# Patient Record
Sex: Female | Born: 1985 | Race: White | Hispanic: No | Marital: Married | Smoking: Current every day smoker
Health system: Southern US, Community
[De-identification: ages and names within clinical notes are randomized; demographics above are authoritative.]

## PROBLEM LIST (undated history)

## (undated) DIAGNOSIS — B977 Papillomavirus as the cause of diseases classified elsewhere: Secondary | ICD-10-CM

## (undated) DIAGNOSIS — K219 Gastro-esophageal reflux disease without esophagitis: Secondary | ICD-10-CM

## (undated) DIAGNOSIS — D496 Neoplasm of unspecified behavior of brain: Secondary | ICD-10-CM

## (undated) DIAGNOSIS — G40909 Epilepsy, unspecified, not intractable, without status epilepticus: Secondary | ICD-10-CM

## (undated) DIAGNOSIS — G35 Multiple sclerosis: Secondary | ICD-10-CM

## (undated) DIAGNOSIS — Z789 Other specified health status: Secondary | ICD-10-CM

## (undated) HISTORY — DX: Gastro-esophageal reflux disease without esophagitis: K21.9

## (undated) HISTORY — DX: Multiple sclerosis: G35

## (undated) HISTORY — DX: Neoplasm of unspecified behavior of brain: D49.6

## (undated) HISTORY — PX: OTHER SURGICAL HISTORY: SHX169

## (undated) HISTORY — DX: Other specified health status: Z78.9

## (undated) HISTORY — DX: Epilepsy, unspecified, not intractable, without status epilepticus: G40.909

## (undated) HISTORY — DX: Papillomavirus as the cause of diseases classified elsewhere: B97.7

---
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
## 2006-08-05 ENCOUNTER — Emergency Department: Payer: Self-pay | Admitting: Emergency Medicine

## 2007-01-02 ENCOUNTER — Emergency Department: Payer: Self-pay | Admitting: Emergency Medicine

## 2007-06-05 ENCOUNTER — Ambulatory Visit: Payer: Self-pay | Admitting: Internal Medicine

## 2007-06-11 ENCOUNTER — Emergency Department: Payer: Self-pay | Admitting: Emergency Medicine

## 2007-09-08 ENCOUNTER — Ambulatory Visit: Payer: Self-pay | Admitting: Obstetrics and Gynecology

## 2007-12-11 ENCOUNTER — Observation Stay: Payer: Self-pay | Admitting: Obstetrics and Gynecology

## 2007-12-12 ENCOUNTER — Ambulatory Visit: Payer: Self-pay | Admitting: Obstetrics and Gynecology

## 2008-01-11 ENCOUNTER — Inpatient Hospital Stay: Payer: Self-pay | Admitting: Obstetrics and Gynecology

## 2008-05-18 ENCOUNTER — Ambulatory Visit: Payer: Self-pay | Admitting: Internal Medicine

## 2008-06-21 ENCOUNTER — Other Ambulatory Visit: Payer: Self-pay

## 2008-06-21 ENCOUNTER — Ambulatory Visit: Payer: Self-pay | Admitting: Obstetrics and Gynecology

## 2008-12-04 ENCOUNTER — Emergency Department: Payer: Self-pay | Admitting: Unknown Physician Specialty

## 2008-12-06 ENCOUNTER — Emergency Department: Payer: Self-pay | Admitting: Emergency Medicine

## 2009-06-06 ENCOUNTER — Ambulatory Visit: Payer: Self-pay | Admitting: Obstetrics and Gynecology

## 2009-07-02 ENCOUNTER — Inpatient Hospital Stay: Payer: Self-pay | Admitting: Obstetrics and Gynecology

## 2009-07-16 ENCOUNTER — Ambulatory Visit: Payer: Self-pay | Admitting: Pediatrics

## 2009-09-19 ENCOUNTER — Ambulatory Visit: Payer: Self-pay | Admitting: Family Medicine

## 2009-09-28 ENCOUNTER — Ambulatory Visit: Payer: Self-pay | Admitting: Surgery

## 2010-02-08 ENCOUNTER — Emergency Department: Payer: Self-pay | Admitting: Emergency Medicine

## 2010-06-21 ENCOUNTER — Ambulatory Visit: Payer: Self-pay | Admitting: Obstetrics and Gynecology

## 2010-08-05 ENCOUNTER — Ambulatory Visit: Payer: Self-pay | Admitting: Internal Medicine

## 2010-11-25 ENCOUNTER — Encounter: Payer: Self-pay | Admitting: Maternal & Fetal Medicine

## 2011-04-09 ENCOUNTER — Inpatient Hospital Stay: Payer: Self-pay | Admitting: Obstetrics and Gynecology

## 2011-04-17 ENCOUNTER — Ambulatory Visit: Payer: Self-pay

## 2011-06-01 ENCOUNTER — Emergency Department: Payer: Self-pay | Admitting: Emergency Medicine

## 2011-06-01 DIAGNOSIS — R569 Unspecified convulsions: Secondary | ICD-10-CM | POA: Insufficient documentation

## 2011-10-09 ENCOUNTER — Ambulatory Visit: Payer: Self-pay | Admitting: Internal Medicine

## 2012-04-04 ENCOUNTER — Ambulatory Visit: Payer: Self-pay | Admitting: Internal Medicine

## 2012-06-07 DIAGNOSIS — G35 Multiple sclerosis: Secondary | ICD-10-CM | POA: Insufficient documentation

## 2012-12-30 DIAGNOSIS — G939 Disorder of brain, unspecified: Secondary | ICD-10-CM | POA: Insufficient documentation

## 2013-03-09 ENCOUNTER — Ambulatory Visit: Payer: Self-pay | Admitting: Family Medicine

## 2013-03-09 LAB — PREGNANCY, URINE: Pregnancy Test, Urine: NEGATIVE m[IU]/mL

## 2013-03-29 DIAGNOSIS — L732 Hidradenitis suppurativa: Secondary | ICD-10-CM | POA: Insufficient documentation

## 2013-05-26 DIAGNOSIS — G40309 Generalized idiopathic epilepsy and epileptic syndromes, not intractable, without status epilepticus: Secondary | ICD-10-CM | POA: Insufficient documentation

## 2014-02-27 ENCOUNTER — Encounter: Payer: Self-pay | Admitting: Obstetrics and Gynecology

## 2014-04-06 ENCOUNTER — Encounter: Payer: Self-pay | Admitting: Obstetrics and Gynecology

## 2014-04-25 ENCOUNTER — Encounter: Payer: Self-pay | Admitting: Pediatric Cardiology

## 2014-08-29 ENCOUNTER — Inpatient Hospital Stay: Payer: Self-pay | Admitting: Obstetrics and Gynecology

## 2014-08-29 LAB — CBC WITH DIFFERENTIAL/PLATELET
Basophil #: 0.1 10*3/uL (ref 0.0–0.1)
Basophil %: 0.4 %
EOS ABS: 0.1 10*3/uL (ref 0.0–0.7)
Eosinophil %: 0.6 %
HCT: 40.2 % (ref 35.0–47.0)
HGB: 13.1 g/dL (ref 12.0–16.0)
Lymphocyte #: 3.3 10*3/uL (ref 1.0–3.6)
Lymphocyte %: 26 %
MCH: 31.8 pg (ref 26.0–34.0)
MCHC: 32.6 g/dL (ref 32.0–36.0)
MCV: 98 fL (ref 80–100)
MONO ABS: 0.4 x10 3/mm (ref 0.2–0.9)
MONOS PCT: 3 %
NEUTROS PCT: 70 %
Neutrophil #: 9 10*3/uL — ABNORMAL HIGH (ref 1.4–6.5)
PLATELETS: 211 10*3/uL (ref 150–440)
RBC: 4.12 10*6/uL (ref 3.80–5.20)
RDW: 12.6 % (ref 11.5–14.5)
WBC: 12.8 10*3/uL — AB (ref 3.6–11.0)

## 2014-08-30 LAB — HEMATOCRIT: HCT: 35.6 % (ref 35.0–47.0)

## 2015-01-06 NOTE — Consult Note (Signed)
Referral Information:  Reason for Referral Recommendations during pregnancy secondary to history of seizures on 2 antiepileptics, multiple sclerosis and low grade brain tumor   Referring Physician [facility]   Prenatal Hx Pregnancy has thus far been uncomplicated.  She has stopped her MS medication and started 4 mg (total) folic acid.  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  She has a history of 2 tonic-clonic seizures about 45 minutes apart, 8 weeks after her last delivery and has never had a subsequent tonic-clonic seizure.  She has a life-long history of temporal lobe seizures which she only found out was likely related to this brain lesion after imaging was performed after her tonic-clonic seizure.  She has since started Lamictal for that which has greatly decreased the frequency of these episodes (usually present with aura, feelings of deja vu and behavioral changes).  She had a Wada scan (to evaluate language and memory evaluation of each hemisphere) during the work up to determine if this lesion could be biopsied or excised.  Ultimately it was decided that given it's stability over time and location, that surgery was currently not an option.   Past Obstetrical Hx 2008 - spontaneous miscarriage 6-8 weeks 2009 SVD at 38+ weeks, live female infant, 4#09 oz, no complications 7353 SVE at 55 weeks, IOL, female infant, 2#99ME, no complications 2683 SVE at 21 weeks, live female, 4#1DQ, no complications until presented to [facility] after having 2 tonic-clonic seizures about 8 weeks postpartum.  Uncertain if this was related to eclampsia (no reportably elevated BPs during the pregnancy) or new onset seizure disorder - started on Keppra.  No tonic-clonic seizures since. 2015 - current   Home Medications: Medication Instructions Status  Nexium 20 mg oral delayed release capsule 1  orally once a day  Active   Keppra 1000 milligram(s) orally 2 times a day Active  LaMICtal 250 mg oral tablet, extended release 1 tab(s) orally once a day Active  Prenatal Multivitamins Prenatal Multivitamins oral tablet 1 tab(s) orally once a day Active  Vitamin D 1  orally once a day Active   Allergies:   Phenergan: Other  Clindamycin: Other  Vital Signs/Notes:  Nursing Vital Signs: **Vital Signs.:   15-Jun-15 09:18  Vital Signs Type Routine  Temperature Temperature (F) 97.1  Celsius 36.1  Temperature Source oral  Pulse Pulse 61  Respirations Respirations 18  Systolic BP Systolic BP 222  Diastolic BP (mmHg) Diastolic BP (mmHg) 65  Mean BP 81  Pulse Ox % Pulse Ox % 100  Oxygen Delivery Room Air/ 21 %   Perinatal Consult:  PGyn Hx History of LEEP age 75; normal PAPs since   PMed Hx Rubella Immune, Hx of varicella   Past Medical History cont'd 1.  GERD - on Nexium 2.  Seizure disorder - see above.  Diagnosed after presenting with 2 tonic-clonic seizures 8 weeks postpartum from her last delivery.  None since after starting Keppra.   3.  Low grade right temporal brain tumor.  She was also diagnosed with temporal lobe seizures after imaging identified a right temporal lobe lesion, thought to be a low grade tumor.  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  She had a Wada scan as well during a workup to determine if she was a candidate for excisional biopsy - she was ultimately determined not to be a candidate and has been followed every  6 months with imaging and no change in size of the tumor has been appreciated.  She started Lamictal about 8 months ago to decrease these seizures (which she describes as deja vu like episodes with onset of aura and funny taste in her mouth and sometimes accompanied by behavioral changes). Of note, the patient reports getting a clot after the angiogram/Wada scan which was treated with Lovenox for about 3 months (these records were not  available for review); given these vessels were instrumented, she is not likely to be a increased risk for recurrence or at significantly increased risk for clot during this pregnancy. 4.  Multiple sclerosis - presumptive diagnosis based on demyelinating lesions also found incidentally during imaging w/up of seizures.  Had a lumbar puncture with evidence of a positive IgG index and oligoclonal band elevation - no symptoms per patient but has been on Tecfidera which she stopped as soon as she found out she was pregnant.   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   Soc Hx Married, quit smoking about 7 years ago; no ETOH or illegal drug use during pregnancy   Review Of Systems:  Fever/Chills No   Cough No   Abdominal Pain No   Diarrhea No   Constipation No   Nausea/Vomiting No   SOB/DOE No   Chest Pain No   Dysuria No   Tolerating Diet Yes   Medications/Allergies Reviewed Medications/Allergies reviewed   Impression/Recommendations:  Impression 29yo at [redacted]w[redacted]d by [facility] of 12/22 (assigned based on [place] performed in April - report not available) with history of seizure disorder, right temporal lobe low grade tumor and MS   Recommendations 1.  Multiple sclerosis - as an autoimmune disorder, patients tend to do very well during pregnancy although they may have an increase in symptoms/relapse postpartum.  As the patient has never has symptoms specifically related to this diagnosis, she is likely to do well.  Recommend continued follow up with her Neurologist to discuss when, postpartum, she should resume her medication. 2.  Epilespy - no tonic-clonic seizures since 3976 - uncertain if this was a new diagnosis or related to postpartum  eclampsia (although cannot rule out, without other symptoms of preeclampsia, this would be unlikely).  Continue Keppra per her Neurologist.  Continue Lamictal per her Neurologist for temporal lobe epilepsy - per the patient, she rarely has these episodes since starting the Lamictal.  Recommend checking levels at least each trimester (or if patient has a seizure).  Agree with folic acid supplementation.  Recommend detailed ultrasound at about 18 weeks (these antiepileptics can be associated with fetal clefting) and fetal echocardiogram at about 22 weeks.  Consider serial ultrasounds for growth in the 3rd trimester and delivery by [facility]. 3.  Brain tumor - unlikely to be an issue given small and stable size since it's discovery in 2012.  Likely it was present during patient's prior pregnancies and she had no complication with pushing/delivery.  Would not defer imaging during the pregnancy if indicated. 4.  Routine prenatal care - patient had first trimester screening performed last week; consider msAFP only after 15 weeks and ultrasounds/fetal echocardiogram as outlined above.   Plan:  Ultrasound at what gestational ages Monthly > 28 weeks   Delivery Mode  Vaginal   Additional Testing Folate/prenatal vitamins    Total Time Spent with Patient 45 minutes   >50% of visit spent in couseling/coordination of care yes   Office Use Only 99243  Level 3 (88min) NEW office consult detailed   Coding Description: MATERNAL CONDITIONS/HISTORY INDICATION(S).   Seizure Disorder.  Electronic Signatures: Wynona Neat (MD)  (Signed 15-Jun-15 11:39)  Authored: Referral, Home Medications, Allergies, Vital Signs/Notes, Consult, Exam, Impression, Plan, Billing, Coding Description   Last Updated: 15-Jun-15 11:39 by Wynona Neat (MD)

## 2015-01-15 IMAGING — CR DG CHEST 2V
1 series · 2 of 2 positions shown · non-contrast
Comparison: none

REASON FOR EXAM: chest pain and SOB started last night.
COMMENTS:

PROCEDURE:     MDR - MDR CHEST PA(OR AP) AND LATERAL  - March 09, 2013  [DATE]
RESULT:     The lungs are clear. The cardiac silhouette and visualized bony
skeleton are unremarkable.

[Series 1: pa · 0.17mm/px · 2 of 2 slices shown]
[im 1/2]
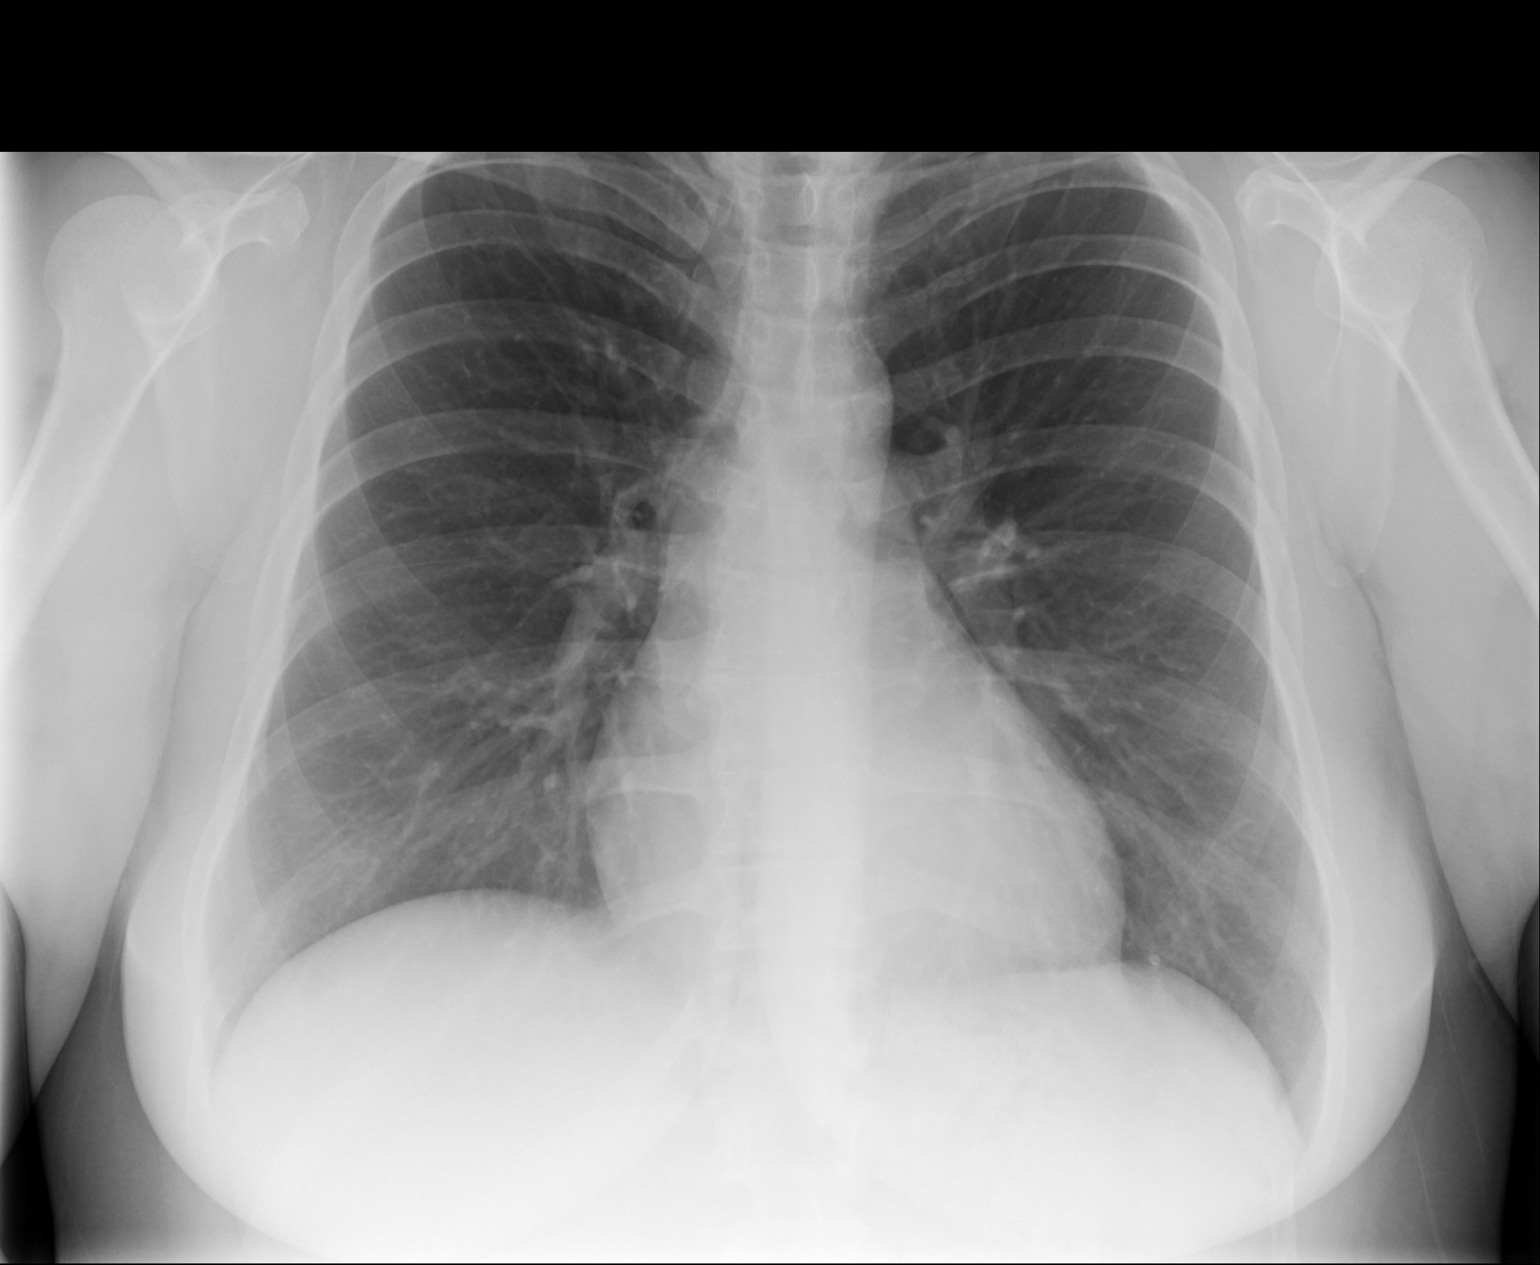
[im 2/2]
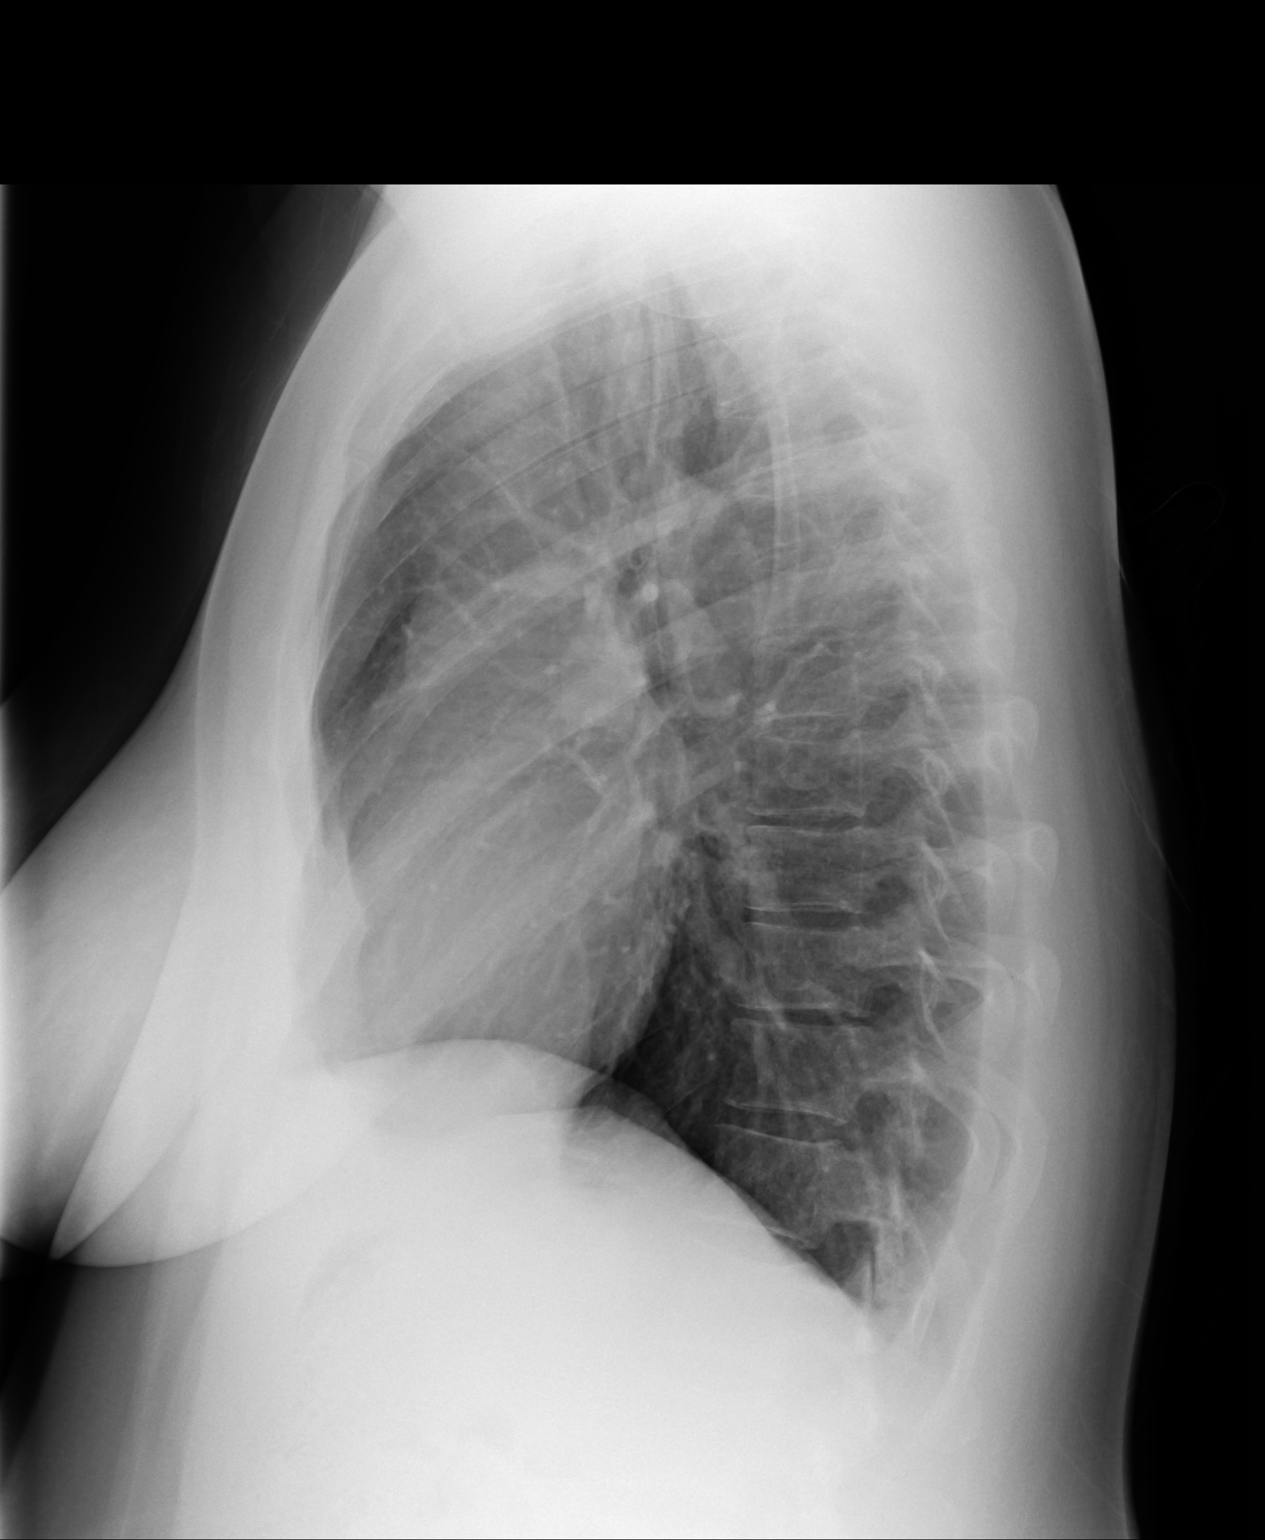

[2 of 2 positions shown; findings below may reference images not displayed]

IMPRESSION: 1. Chest radiograph without evidence of acute cardiopulmonary disease.
Comparison made to prior study dated 06/01/2011

## 2015-01-23 NOTE — H&P (Signed)
L&D Evaluation:  History:  HPI 84 yomwf [id number], estimated date of confinement 09/05/2014, admitted for IOL at 35.o weeks.   Patient's Medical History Brain tumor; morphine sulfate;Epilepsy; Rh-   Patient's Surgical History none   Medications Pre Natal Vitamins  Keppra;Lamictal; Nexium; Folic Acid   Allergies NKDA   Social History none   Family History Non-Contributory   ROS:  ROS All systems were reviewed.  HEENT, CNS, GI, GU, Respiratory, CV, Renal and Musculoskeletal systems were found to be normal.   Exam:  Vital Signs stable   General no apparent distress   Mental Status clear   Abdomen gravid, non-tender   Estimated Fetal Weight Average for gestational age, 8#0   Back no CVAT   Edema 1+   Pelvic no external lesions, 3-4/80/-2/vtx   Mebranes Intact   FHT normal rate with no decels   Skin dry   Lymph no lymphadenopathy   Other Rh-;GBS-   Impression:  Impression TIUP; Rh-; Seizure Disorder, MS, Brain Tumor   Plan:  Plan Pitocin IOL   Follow Up Appointment in 6 weeks   Electronic Signatures: DeFrancesco, Alanda Slim (MD)  (Signed 15-Dec-15 09:51)  Authored: L&D Evaluation   Last Updated: 15-Dec-15 09:51 by DeFrancesco, Alanda Slim (MD)

## 2015-02-22 ENCOUNTER — Telehealth: Payer: Self-pay | Admitting: Obstetrics and Gynecology

## 2015-02-22 DIAGNOSIS — B373 Candidiasis of vulva and vagina: Secondary | ICD-10-CM

## 2015-02-22 DIAGNOSIS — B3731 Acute candidiasis of vulva and vagina: Secondary | ICD-10-CM

## 2015-02-22 MED ORDER — FLUCONAZOLE 150 MG PO TABS: 150.0000 mg | ORAL_TABLET | Freq: Every day | ORAL | Status: DC

## 2015-02-22 NOTE — Telephone Encounter (Signed)
PT CAME 1 MONTH AGO FOR YEAST AND WAS GIVEN DIFLUCAN, PROBLEM STILL EXIST BUT WORSE, BURNING ITCHING, WHITE DISCHARGE, HAS USED OTC MONISTAT CREAM,ALSO.  SHE ALSO FEELS THAT THIS IS MESSING WITH HER BRAIN TUMOR, HAS HAS MORE SEIZURES IN THE LAST 2 WKS THAN IN THE PAST YR. PT IS TAKING HER SEIZURES MED.

## 2015-02-22 NOTE — Telephone Encounter (Signed)
PT STATES SHE IS STILL HAVING A WHITE D/C. HAVING A VAGINAL PAIN/DISCOMFORT. PER MAD CAN SEND IN A DIFLUCAN. PT AWARE IF NO BETTER IN 5 DAYS SHE WILL NEED TO BE SEEN.

## 2015-03-08 ENCOUNTER — Ambulatory Visit (INDEPENDENT_AMBULATORY_CARE_PROVIDER_SITE_OTHER): Admitting: Obstetrics and Gynecology

## 2015-03-08 ENCOUNTER — Encounter: Payer: Self-pay | Admitting: Obstetrics and Gynecology

## 2015-03-08 VITALS — BP 95/64 | HR 78 | Ht 63.0 in | Wt 197.5 lb

## 2015-03-08 DIAGNOSIS — K219 Gastro-esophageal reflux disease without esophagitis: Secondary | ICD-10-CM

## 2015-03-08 DIAGNOSIS — Z9149 Other personal history of psychological trauma, not elsewhere classified: Secondary | ICD-10-CM

## 2015-03-08 DIAGNOSIS — IMO0002 Reserved for concepts with insufficient information to code with codable children: Secondary | ICD-10-CM | POA: Insufficient documentation

## 2015-03-08 DIAGNOSIS — N898 Other specified noninflammatory disorders of vagina: Secondary | ICD-10-CM | POA: Diagnosis not present

## 2015-03-08 NOTE — Progress Notes (Signed)
Patient ID: Tiffany Tucker, female   DOB: September 04, 1986, 29 y.o.   MRN: [id number]   Pt c/o of vaginal d/c and pain. 2 weeks. Treated with 2 diflucan. Did not help.    GYN ENCOUNTER NOTE  Subjective:       Tiffany Tucker is a 29 y.o. Para 64  female is here for gynecologic evaluation of the following issues:  1. Chronic vaginitis  Patient is status post 2 doses of Diflucan, each one week apart.  She still complains of vaginal discharge and irritation.     Gynecologic History No LMP recorded. Patient is not currently having periods (Reason: Other). Contraception: condoms  Obstetric History OB History  No data available    Past Medical History  Diagnosis Date  . Breastfeeding (infant)   . GERD (gastroesophageal reflux disease)   . Brain tumor   . Epilepsy   . Multiple sclerosis   . HPV in female     Past Surgical History  Procedure Laterality Date  . Wisdom tooth extraction  2003  . Leep  2005    Current Outpatient Prescriptions on File Prior to Visit  Medication Sig Dispense Refill  . fluconazole (DIFLUCAN) 150 MG tablet Take 1 tablet (150 mg total) by mouth daily. 1 tablet 0   No current facility-administered medications on file prior to visit.    Allergies  Allergen Reactions  . Promethazine Other (See Comments)    RLS restless leg syndrome  . Promethazine Hcl Other (See Comments)    RLS  . Clindamycin Diarrhea    Had c.diff colitis    History   Social History  . Marital Status: Married    Spouse Name: N/A  . Number of Children: N/A  . Years of Education: N/A   Occupational History  . Not on file.   Social History Main Topics  . Smoking status: Never Smoker   . Smokeless tobacco: Not on file  . Alcohol Use: No  . Drug Use: No  . Sexual Activity: No   Other Topics Concern  . Not on file   Social History Narrative  . No narrative on file    Family History  Problem Relation Age of Onset  . High Cholesterol Father   .  Ovarian cancer Neg Hx   . Diabetes Neg Hx   . Heart disease Neg Hx   . Colon cancer Neg Hx   . Breast cancer Other     The following portions of the patient's history were reviewed and updated as appropriate: allergies, current medications, past family history, past medical history, past social history, past surgical history and problem list.  Review of Systems  Review of Systems - General ROS: negative for - chills, , fever, hot flashes, malaise or night sweats.POSITIVE-fatigue Hematological and Lymphatic ROS: negative for - bleeding problems or swollen lymph nodes Gastrointestinal ROS: negative for - abdominal pain, blood in stools, change in bowel habits and nausea/vomiting Musculoskeletal ROS: negative for - joint pain, muscle pain or muscular weakness Genito-Urinary ROS: negative for - change in menstrual cycle, dysmenorrhea, dyspareunia, dysuria, , genital ulcers, hematuria, incontinence, irregular/heavy menses, nocturia or pelvic pain.POSITIVE-genital discharge  Objective:   BP 95/64 mmHg  Pulse 78  Ht 5\' 3"  (1.6 m)  Wt 197 lb 8 oz (89.585 kg)  BMI 34.99 kg/m2 CONSTITUTIONAL: Well-developed, well-nourished female in no acute distress.  HENT:  Normocephalic, atraumatic.  SKIN: Skin is warm and dry. No rash noted. Not diaphoretic. No erythema. No pallor. Velma: Alert  and oriented to person, place, and time. PSYCHIATRIC: Normal mood and affect. Normal behavior. Normal judgment and thought content. CARDIOVASCULAR:Not Examined RESPIRATORY: Not Examined BREASTS: Not Examined ABDOMEN: Soft, non distended; Non tender.  No Organomegaly. PELVIC:  External Genitalia: Normal  BUS: Normal  Vagina: Normal  Cervix: Normal  RV: Normal   Bladder: Nontender  PROCEDURE: Wet prep. Normal saline-negative. KOH-negative     Assessment:   1. Vaginal discharge-Normal Wet prep  - NuSwab BV and Candida, NAA       Plan:   1.  Wet prep negative.  Reassurance given. 2.   Nuswab for BV and Candida sent

## 2015-03-09 NOTE — Addendum Note (Signed)
Addended by: Elouise Munroe on: 03/09/2015 10:33 AM   Modules accepted: Orders

## 2015-03-12 ENCOUNTER — Ambulatory Visit: Payer: Self-pay | Admitting: Obstetrics and Gynecology

## 2015-03-13 ENCOUNTER — Telehealth: Payer: Self-pay

## 2015-03-13 NOTE — Telephone Encounter (Signed)
PT AWARE  

## 2015-03-13 NOTE — Telephone Encounter (Signed)
-----   Message from Brayton Mars, MD sent at 03/13/2015  2:14 PM EDT ----- Please Notify - Labs normal

## 2015-03-14 LAB — NUSWAB BV AND CANDIDA, NAA

## 2015-03-29 ENCOUNTER — Encounter: Payer: Self-pay | Admitting: Obstetrics and Gynecology

## 2015-03-29 ENCOUNTER — Ambulatory Visit (INDEPENDENT_AMBULATORY_CARE_PROVIDER_SITE_OTHER): Admitting: Obstetrics and Gynecology

## 2015-03-29 VITALS — BP 104/67 | HR 64 | Ht 63.0 in | Wt 198.5 lb

## 2015-03-29 DIAGNOSIS — Z01419 Encounter for gynecological examination (general) (routine) without abnormal findings: Secondary | ICD-10-CM

## 2015-03-29 DIAGNOSIS — N912 Amenorrhea, unspecified: Secondary | ICD-10-CM

## 2015-03-29 DIAGNOSIS — E669 Obesity, unspecified: Secondary | ICD-10-CM | POA: Diagnosis not present

## 2015-03-29 NOTE — Progress Notes (Signed)
Patient ID: Tiffany Tucker, female   DOB: [birth year], 29 y.o.   MRN: [id number]  ANNUAL PREVENTATIVE CARE GYN  ENCOUNTER NOTE  Subjective:       Tiffany Tucker is a 29 y.o. para 98  female here for a routine annual gynecologic exam.  Current complaints: 1.  Still thinks she has hsv?? 2 blood draws both neg;no signs or symptoms today. 2.  Amenorrhea, lactational  Patient is breast-feeding exclusively and has not had menses since delivery.  Sex is infrequent.  She is not using other perception.  Recent follow-up with her neurologist is notable for stable MS and stable brain tumor.    Gynecologic History No LMP recorded. Patient is not currently having periods (Reason: Other). Contraception: coitus interruptus- breastfeeding Last Pap: 2015. Results were: normal Last mammogram: n/a Results were: n/a  Obstetric History OB History  No data available    Past Medical History  Diagnosis Date  . Breastfeeding (infant)   . GERD (gastroesophageal reflux disease)   . Brain tumor   . Epilepsy   . Multiple sclerosis   . HPV in female     Past Surgical History  Procedure Laterality Date  . Wisdom tooth extraction  2003  . Leep  2005    Current Outpatient Prescriptions on File Prior to Visit  Medication Sig Dispense Refill  . Cholecalciferol (D-5000) 5000 UNITS TABS Take by mouth.    . Cranberry ([employer] CRANBERRY) 500 MG CAPS Take by mouth.    . esomeprazole (NEXIUM) 40 MG capsule Take by mouth.    . lamoTRIgine (LAMICTAL) 200 MG tablet Take by mouth.    . levETIRAcetam (KEPPRA) 500 MG tablet Take by mouth.    . Omega-3 1000 MG CAPS Take by mouth.    . Prenat-Fe Bisgly-FA-w/o Vit A (COMPLETE PRENATAL) 30-0.975 MG TABS Take by mouth.     No current facility-administered medications on file prior to visit.    Allergies  Allergen Reactions  . Promethazine Hcl Other (See Comments)    RLS  . Clindamycin Diarrhea    Had c.diff colitis    History   Social  History  . Marital Status: Married    Spouse Name: N/A  . Number of Children: N/A  . Years of Education: N/A   Occupational History  . Not on file.   Social History Main Topics  . Smoking status: Never Smoker   . Smokeless tobacco: Not on file  . Alcohol Use: No  . Drug Use: No  . Sexual Activity: Yes    Birth Control/ Protection: None   Other Topics Concern  . Not on file   Social History Narrative    Family History  Problem Relation Age of Onset  . High Cholesterol Father   . Ovarian cancer Neg Hx   . Diabetes Neg Hx   . Heart disease Neg Hx   . Colon cancer Neg Hx   . Cancer Neg Hx   . Breast cancer Other     The following portions of the patient's history were reviewed and updated as appropriate: allergies, current medications, past family history, past medical history, past social history, past surgical history and problem list.  Review of Systems ROS Review of Systems - General ROS: negative for - chills, fatigue, fever, hot flashes, night sweats, weight gain or weight loss Psychological ROS: negative for - anxiety, decreased libido, depression, mood swings, physical abuse or sexual abuse Ophthalmic ROS: negative for - blurry vision, eye pain or loss of  vision ENT ROS: negative for - headaches, hearing change, visual changes or vocal changes Allergy and Immunology ROS: negative for - hives, itchy/watery eyes or seasonal allergies Hematological and Lymphatic ROS: negative for - bleeding problems, bruising, swollen lymph nodes or weight loss Endocrine ROS: negative for - galactorrhea, hair pattern changes, hot flashes, malaise/lethargy, mood swings, palpitations, polydipsia/polyuria, skin changes, temperature intolerance or unexpected weight changes Breast ROS: negative for - new or changing breast lumps or nipple discharge Respiratory ROS: negative for - cough or shortness of breath Cardiovascular ROS: negative for - chest pain, irregular heartbeat, palpitations or  shortness of breath Gastrointestinal ROS: no abdominal pain, change in bowel habits, or black or bloody stools Genito-Urinary ROS: no dysuria, trouble voiding, or hematuria Musculoskeletal ROS: negative for - joint pain or joint stiffness Neurological ROS: negative for - bowel and bladder control changes Dermatological ROS: negative for rash and skin lesion changes   Objective:   BP 104/67 mmHg  Pulse 64  Ht 5\' 3"  (1.6 m)  Wt 198 lb 8 oz (90.039 kg)  BMI 35.17 kg/m2 CONSTITUTIONAL: Well-developed, well-nourished female in no acute distress.  PSYCHIATRIC: Normal mood and affect. Normal behavior. Normal judgment and thought content. [place]: Alert and oriented to person, place, and time. Normal muscle tone coordination. No cranial nerve deficit noted. HENT:  Normocephalic, atraumatic, External right and left ear normal. Oropharynx is clear and moist EYES: Conjunctivae and EOM are normal. Pupils are equal, round, and reactive to light. No scleral icterus.  NECK: Normal range of motion, supple, no masses.  Normal thyroid.  SKIN: Skin is warm and dry. No rash noted. Not diaphoretic. No erythema. No pallor. CARDIOVASCULAR: Normal heart rate noted, regular rhythm, no murmur. RESPIRATORY: Clear to auscultation bilaterally. Effort and breath sounds normal, no problems with respiration noted. BREASTS: Symmetric in size. No masses, skin changes, nipple drainage, or lymphadenopathy. ABDOMEN: Soft, normal bowel sounds, no distention noted.  No tenderness, rebound or guarding.  BLADDER: Normal PELVIC:  External Genitalia: Normal  BUS: Normal  Vagina: Normal  Cervix: Normal  Uterus: Normal  Adnexa: Normal  RV: External Exam NormaI  MUSCULOSKELETAL: Normal range of motion. No tenderness.  No cyanosis, clubbing, or edema.  2+ distal pulses. LYMPHATIC: No Axillary, Supraclavicular, or Inguinal Adenopathy.    Assessment:   Annual gynecologic examination 29 y.o.  Lactational  amenorrhea Contraception: coitus interruptus Obesity 1    Plan:  Pap: Pap Co Test Mammogram: Not Indicated Stool Guaiac Testing:  Not Indicated Labs: lipids, vit d, tsh, a1c, fbs Routine preventative health maintenance measures emphasized: Exercise/Diet/Weight control Condoms encouraged Return to Clinic - 1 [address] [place], CMA   Brayton Mars, MD

## 2015-04-03 LAB — PAP IG AND HPV HIGH-RISK: PAP SMEAR COMMENT: 0

## 2015-04-21 LAB — TSH: TSH: 1.94 u[IU]/mL (ref 0.450–4.500)

## 2015-04-25 ENCOUNTER — Telehealth: Payer: Self-pay | Admitting: Obstetrics and Gynecology

## 2015-04-25 LAB — LIPID PANEL
CHOL/HDL RATIO: 3.2 ratio (ref 0.0–4.4)
Cholesterol, Total: 184 mg/dL (ref 100–199)
HDL: 58 mg/dL (ref >39)
LDL Calculated: 109 mg/dL — ABNORMAL HIGH (ref 0–99)
TRIGLYCERIDES: 84 mg/dL (ref 0–149)
VLDL CHOLESTEROL CAL: 17 mg/dL (ref 5–40)

## 2015-04-25 LAB — VITAMIN D 25 HYDROXY (VIT D DEFICIENCY, FRACTURES): Vit D, 25-Hydroxy: 27.9 ng/mL — ABNORMAL LOW (ref 30.0–100.0)

## 2015-04-25 LAB — GLUCOSE, RANDOM: Glucose: 79 mg/dL (ref 65–99)

## 2015-04-25 LAB — HEMOGLOBIN A1C
Est. average glucose Bld gHb Est-mCnc: 103 mg/dL
Hgb A1c MFr Bld: 5.2 % (ref 4.8–5.6)

## 2015-04-25 NOTE — Telephone Encounter (Signed)
Pt called and just wanted Dr Tennis Must to know that she had her labs done that he orderd.

## 2015-07-14 ENCOUNTER — Ambulatory Visit
Admission: EM | Admit: 2015-07-14 | Discharge: 2015-07-14 | Disposition: A | Discharge status: Home / Self Care | Attending: Family Medicine | Admitting: Family Medicine

## 2015-07-14 DIAGNOSIS — S60042A Contusion of left ring finger without damage to nail, initial encounter: Secondary | ICD-10-CM | POA: Diagnosis not present

## 2015-07-14 DIAGNOSIS — S6000XA Contusion of unspecified finger without damage to nail, initial encounter: Secondary | ICD-10-CM | POA: Diagnosis not present

## 2015-07-14 NOTE — Discharge Instructions (Signed)
Cryotherapy Cryotherapy is when you put ice on your injury. Ice helps lessen pain and puffiness (swelling) after an injury. Ice works the best when you start using it in the first 24 to 48 hours after an injury. HOME CARE  Put a dry or damp towel between the ice pack and your skin.  You may press gently on the ice pack.  Leave the ice on for no more than 10 to 20 minutes at a time.  Check your skin after 5 minutes to make sure your skin is okay.  Rest at least 20 minutes between ice pack uses.  Stop using ice when your skin loses feeling (numbness).  Do not use ice on someone who cannot tell you when it hurts. This includes small children and people with memory problems (dementia). GET HELP RIGHT AWAY IF:  You have white spots on your skin.  Your skin turns blue or pale.  Your skin feels waxy or hard.  Your puffiness gets worse. MAKE SURE YOU:   Understand these instructions.  Will watch your condition.  Will get help right away if you are not doing well or get worse.   This information is not intended to replace advice given to you by your health care provider. Make sure you discuss any questions you have with your health care provider.   Document Released: 02/18/2008 Document Revised: 11/24/2011 Document Reviewed: 04/24/2011 Elsevier Interactive Patient Education 2016 [place] A contusion is a deep bruise. Contusions happen when an injury causes bleeding under the skin. Symptoms of bruising include pain, swelling, and discolored skin. The skin may turn blue, purple, or yellow. HOME CARE   Rest the injured area.  If told, put ice on the injured area.  Put ice in a plastic bag.  Place a towel between your skin and the bag.  Leave the ice on for 20 minutes, 2-3 times per day.  If told, put light pressure (compression) on the injured area using an elastic bandage. Make sure the bandage is not too tight. Remove it and put it back on as told by your  doctor.  If possible, raise (elevate) the injured area above the level of your heart while you are sitting or lying down.  Take over-the-counter and prescription medicines only as told by your doctor. GET HELP IF:  Your symptoms do not get better after several days of treatment.  Your symptoms get worse.  You have trouble moving the injured area. GET HELP RIGHT AWAY IF:   You have very bad pain.  You have a loss of feeling (numbness) in a hand or foot.  Your hand or foot turns pale or cold.   This information is not intended to replace advice given to you by your health care provider. Make sure you discuss any questions you have with your health care provider.   Document Released: 02/18/2008 Document Revised: 05/23/2015 Document Reviewed: 01/17/2015 Elsevier Interactive Patient Education [employer].

## 2015-07-14 NOTE — ED Notes (Signed)
Pt states she smashed her left 4th finger yesterday and has ecchymosis and swelling to the site

## 2015-07-14 NOTE — ED Provider Notes (Signed)
CSN: [id number]     Arrival date & time 07/14/15  0804 History   First MD Initiated Contact with Patient 07/14/15 [phone]    Nurses notes were reviewed.  Chief Complaint  Patient presents with  . Hand Pain   (Consider location/radiation/quality/duration/timing/severity/associated sxs/prior Treatment) Patient is a 29 y.o. female presenting with hand pain.  Hand Pain This is a new problem. The current episode started more than 2 days ago (Thursday). The problem has been gradually worsening. Pertinent negatives include no chest pain, no abdominal pain, no headaches and no shortness of breath. The symptoms are aggravated by bending. Nothing relieves the symptoms. She has tried a cold compress (alleve) for the symptoms.   Patient states that she got the fourth finger caught between a dumpster and at child's tricycle on Thursday.  She used ice on time for 20 minutes but the finger has starts to hurt because of the swelling. She basically came in to see if I would put a needle in an open finger to the blood drain. Past Medical History  Diagnosis Date  . Breastfeeding (infant)   . GERD (gastroesophageal reflux disease)   . Brain tumor ([place])   . Epilepsy ([place])   . Multiple sclerosis ([place])   . HPV in female    Past Surgical History  Procedure Laterality Date  . Wisdom tooth extraction  2003  . Leep  2005  . Wada test     Family History  Problem Relation Age of Onset  . High Cholesterol Father   . Ovarian cancer Neg Hx   . Diabetes Neg Hx   . Heart disease Neg Hx   . Colon cancer Neg Hx   . Cancer Neg Hx   . Breast cancer Other    Social History  Substance Use Topics  . Smoking status: Never Smoker   . Smokeless tobacco: None  . Alcohol Use: No   OB History    No data available     Review of Systems  Respiratory: Negative for shortness of breath.   Cardiovascular: Negative for chest pain.  Gastrointestinal: Negative for abdominal pain.  Neurological: Negative for headaches.    All other systems reviewed and are negative.   Allergies  Promethazine hcl and Clindamycin  Home Medications   Prior to Admission medications   Medication Sig Start Date End Date Taking? Authorizing Provider  Cholecalciferol (D-5000) 5000 UNITS TABS Take by mouth.    Historical Provider, MD  Cranberry ([facility] CRANBERRY) 500 MG CAPS Take by mouth.    Historical Provider, MD  esomeprazole (NEXIUM) 40 MG capsule Take by mouth.    Historical Provider, MD  lamoTRIgine (LAMICTAL) 200 MG tablet Take by mouth.    Historical Provider, MD  levETIRAcetam (KEPPRA) 500 MG tablet Take by mouth. 06/08/12   Historical Provider, MD  Omega-3 1000 MG CAPS Take by mouth.    Historical Provider, MD  Prenat-Fe Bisgly-FA-w/o Vit A (COMPLETE PRENATAL) 30-0.975 MG TABS Take by mouth. 06/03/11   Historical Provider, MD   Meds Ordered and Administered this Visit  Medications - No data to display  BP 103/67 mmHg  Pulse 71  Temp(Src) 97.7 F (36.5 C) (Oral)  Resp 16  Ht 5\' 3"  (1.6 m)  Wt 180 lb (81.647 kg)  BMI 31.89 kg/m2  SpO2 99%  LMP  No data found.   Physical Exam  Constitutional: She is oriented to person, place, and time. She appears well-developed and well-nourished.  HENT:  Head: Normocephalic and atraumatic.  Eyes:  Conjunctivae are normal. Pupils are equal, round, and reactive to light.  Musculoskeletal: Normal range of motion. She exhibits edema and tenderness.       Hands: Neurological: She is alert and oriented to person, place, and time.  Skin: Skin is warm and dry.  Psychiatric: She has a normal mood and affect. Her behavior is normal.  Vitals reviewed.  should be noted that there is no frank subungual hematoma the host finger is swollen and there is a hematoma from about the mid middle of the middle phalanx fourth finger to the tip there is swelling as well  ED Course  Procedures (including critical care time)  Labs Review Labs Reviewed - No data to display  Imaging Review No  results found.   Visual Acuity Review  Right Eye Distance:   Left Eye Distance:   Bilateral Distance:    Right Eye Near:   Left Eye Near:    Bilateral Near:         MDM   1. Contusion of fourth finger of left hand, initial encounter   2. Traumatic hematoma of finger, initial encounter     Explained to patient at this time I would not put a needle in this finger she needs to ice it more frequently and more around-the-clock. [phone] platelet patient also that the needles put in the chances of infection is going to increase greatly with possible unwanted complications. She is now informed me that she has placed a needle in the finger a couple times but has not been successful another good reason not to do this. Discussed the need for x-ray but even if there is a tuft fracture nothing different from be done. She seems to have good range of motion of the finger with the understanding that with this much swelling that the finger has she is on have some limitation of motion. She is breast-feeding and will use Mobic other continue using either leave her Motrin for the discomfort. I did offer her that if things not better on Monday on today she can call us and I will refer to hand surgeon. Patient was disappointed that I would not open finger to drain it.   Frederich Cha, MD 07/14/15 [phone]

## 2015-08-22 ENCOUNTER — Ambulatory Visit: Admitting: Obstetrics and Gynecology

## 2015-08-23 ENCOUNTER — Ambulatory Visit: Admitting: Obstetrics and Gynecology

## 2015-09-06 ENCOUNTER — Encounter: Payer: Self-pay | Admitting: Obstetrics and Gynecology

## 2015-09-06 ENCOUNTER — Ambulatory Visit (INDEPENDENT_AMBULATORY_CARE_PROVIDER_SITE_OTHER): Admitting: Obstetrics and Gynecology

## 2015-09-06 VITALS — BP 131/74 | HR 73 | Ht 63.0 in | Wt 182.9 lb

## 2015-09-06 DIAGNOSIS — N946 Dysmenorrhea, unspecified: Secondary | ICD-10-CM | POA: Diagnosis not present

## 2015-09-06 DIAGNOSIS — N939 Abnormal uterine and vaginal bleeding, unspecified: Secondary | ICD-10-CM

## 2015-09-06 DIAGNOSIS — Z30011 Encounter for initial prescription of contraceptive pills: Secondary | ICD-10-CM

## 2015-09-06 MED ORDER — NORETHINDRONE 0.35 MG PO TABS: 1.0000 | ORAL_TABLET | Freq: Every day | ORAL | Status: DC

## 2015-09-06 NOTE — Progress Notes (Signed)
Chief complaint: 1. Conference regarding abnormal uterine bleeding  Patient presents for a conference to discuss suppression of menstrual cycles. She is currently breast-feeding. She has 4 children and is uncertain about possibly having other children in the future. She no longer desires to have any vaginal bleeding.  Options of management have been reviewed including both reversible and permanent methods. Mini pill while nursing. Continuous oral contraceptives. Depo-Provera. NovaSure endometrial ablation if no further children are desired  Husband has gotten vasectomy.  ASSESSMENT: 1. Abnormal uterine bleeding while nursing  PLAN: 1. Begin minipill. 2. Return as needed.  A total of 15 minutes were spent face-to-face with the patient during this encounter and over half of that time dealt with counseling and coordination of care.  Brayton Mars, MD

## 2015-09-06 NOTE — Patient Instructions (Signed)
1. The mini pill for cycle suppression while breast-feeding 2. Follow up as needed.

## 2015-11-26 ENCOUNTER — Other Ambulatory Visit: Payer: Self-pay | Admitting: Obstetrics and Gynecology

## 2015-12-12 ENCOUNTER — Encounter: Payer: Self-pay | Admitting: Obstetrics and Gynecology

## 2015-12-12 ENCOUNTER — Ambulatory Visit (INDEPENDENT_AMBULATORY_CARE_PROVIDER_SITE_OTHER): Admitting: Obstetrics and Gynecology

## 2015-12-12 VITALS — BP 98/62 | HR 66 | Ht 63.0 in | Wt 192.5 lb

## 2015-12-12 DIAGNOSIS — M2550 Pain in unspecified joint: Secondary | ICD-10-CM | POA: Diagnosis not present

## 2015-12-12 DIAGNOSIS — N898 Other specified noninflammatory disorders of vagina: Secondary | ICD-10-CM | POA: Insufficient documentation

## 2015-12-12 DIAGNOSIS — M791 Myalgia: Secondary | ICD-10-CM

## 2015-12-12 DIAGNOSIS — M7918 Myalgia, other site: Secondary | ICD-10-CM

## 2015-12-12 NOTE — Progress Notes (Signed)
Chief complaint: 1. Vaginal discharge 2. Diffuse muscle aching and joint pains  Patient has been having chronic vaginal discharge with vulvar itching for over a month. She has used multiple over-the-counter antifungal medications without relief. She also is experiencing muscle aches and joint aches.  OBJECTIVE: BP 98/62 mmHg  Pulse 66  Ht 5' 3"  (1.6 m)  Wt 192 lb 8 oz (87.317 kg)  BMI 34.11 kg/m2 Pleasant white female in no acute distress Pelvic: External genitalia normal BUS-normal Vagina-thin milky white secretions present; no lesions Cervix-normal Bimanual-deferred  Procedure: Wet prep KOH-negative Normal saline-negative Impression-NORMAL  ASSESSMENT: 1. Vaginal discharge with vulvar itching; normal white prep 2. Muscle aches and joint aches, unclear etiology  PLAN: 1. Wet prep-negative 2. Nu swab plus 3. CBC, CMP, ESR, creatine kinase 4. Patient will be notified by phone of results and further management planning  A total of 15 minutes were spent face-to-face with the patient during this encounter and over half of that time dealt with counseling and coordination of care.  Brayton Mars, MD  Note: This dictation was prepared with Dragon dictation along with smaller phrase technology. Any transcriptional errors that result from this process are unintentional.

## 2015-12-12 NOTE — Patient Instructions (Signed)
1. Vaginal culture obtained (nu swab) 2. Blood work ordered to assess for muscle aches and pains 3. Results will be made available

## 2015-12-13 ENCOUNTER — Telehealth: Payer: Self-pay

## 2015-12-13 LAB — CBC WITH DIFFERENTIAL/PLATELET
BASOS ABS: 0 10*3/uL (ref 0.0–0.2)
Basos: 1 %
EOS (ABSOLUTE): 0.1 10*3/uL (ref 0.0–0.4)
Eos: 1 %
Hematocrit: 41.9 % (ref 34.0–46.6)
Hemoglobin: 14 g/dL (ref 11.1–15.9)
Immature Grans (Abs): 0 10*3/uL (ref 0.0–0.1)
Immature Granulocytes: 0 %
Lymphocytes Absolute: 2.1 10*3/uL (ref 0.7–3.1)
Lymphs: 33 %
MCH: 29.8 pg (ref 26.6–33.0)
MCHC: 33.4 g/dL (ref 31.5–35.7)
MCV: 89 fL (ref 79–97)
MONOS ABS: 0.6 10*3/uL (ref 0.1–0.9)
Monocytes: 9 %
NEUTROS ABS: 3.5 10*3/uL (ref 1.4–7.0)
Neutrophils: 56 %
PLATELETS: 241 10*3/uL (ref 150–379)
RBC: 4.7 x10E6/uL (ref 3.77–5.28)
RDW: 12.4 % (ref 12.3–15.4)
WBC: 6.2 10*3/uL (ref 3.4–10.8)

## 2015-12-13 LAB — COMPREHENSIVE METABOLIC PANEL
A/G RATIO: 1.8 (ref 1.2–2.2)
ALK PHOS: 105 IU/L (ref 39–117)
ALT: 15 IU/L (ref 0–32)
AST: 19 IU/L (ref 0–40)
Albumin: 4.6 g/dL (ref 3.5–5.5)
BUN/Creatinine Ratio: 18 (ref 8–20)
BUN: 16 mg/dL (ref 6–20)
Bilirubin Total: 0.4 mg/dL (ref 0.0–1.2)
CALCIUM: 9.5 mg/dL (ref 8.7–10.2)
CHLORIDE: 99 mmol/L (ref 96–106)
CO2: 25 mmol/L (ref 18–29)
Creatinine, Ser: 0.89 mg/dL (ref 0.57–1.00)
GFR calc Af Amer: 100 mL/min/{1.73_m2} (ref >59)
GFR calc non Af Amer: 87 mL/min/{1.73_m2} (ref >59)
GLOBULIN, TOTAL: 2.6 g/dL (ref 1.5–4.5)
Glucose: 85 mg/dL (ref 65–99)
POTASSIUM: 4.4 mmol/L (ref 3.5–5.2)
SODIUM: 139 mmol/L (ref 134–144)
Total Protein: 7.2 g/dL (ref 6.0–8.5)

## 2015-12-13 LAB — SEDIMENTATION RATE: SED RATE: 2 mm/h (ref 0–32)

## 2015-12-13 LAB — CK: CK TOTAL: 90 U/L (ref 24–173)

## 2015-12-13 NOTE — Telephone Encounter (Signed)
-----   Message from Brayton Mars, MD sent at 12/13/2015  7:33 AM EDT ----- Please Notify - Labs normal Nu swab pending

## 2015-12-13 NOTE — Telephone Encounter (Signed)
Pt aware per vm.  

## 2015-12-15 LAB — NUSWAB VAGINITIS PLUS (VG+)
CANDIDA GLABRATA, NAA: NEGATIVE
CHLAMYDIA TRACHOMATIS, NAA: NEGATIVE
Candida albicans, NAA: NEGATIVE
NEISSERIA GONORRHOEAE, NAA: NEGATIVE
Trich vag by NAA: NEGATIVE

## 2016-01-04 ENCOUNTER — Encounter: Payer: Self-pay | Admitting: Emergency Medicine

## 2016-01-04 ENCOUNTER — Ambulatory Visit
Admission: EM | Admit: 2016-01-04 | Discharge: 2016-01-04 | Disposition: A | Discharge status: Home / Self Care | Attending: Family Medicine | Admitting: Family Medicine

## 2016-01-04 DIAGNOSIS — R51 Headache: Secondary | ICD-10-CM

## 2016-01-04 DIAGNOSIS — D332 Benign neoplasm of brain, unspecified: Secondary | ICD-10-CM | POA: Diagnosis not present

## 2016-01-04 DIAGNOSIS — S30860A Insect bite (nonvenomous) of lower back and pelvis, initial encounter: Secondary | ICD-10-CM | POA: Diagnosis not present

## 2016-01-04 DIAGNOSIS — M5481 Occipital neuralgia: Secondary | ICD-10-CM

## 2016-01-04 DIAGNOSIS — W57XXXA Bitten or stung by nonvenomous insect and other nonvenomous arthropods, initial encounter: Secondary | ICD-10-CM | POA: Diagnosis not present

## 2016-01-04 DIAGNOSIS — R519 Headache, unspecified: Secondary | ICD-10-CM

## 2016-01-04 DIAGNOSIS — G35 Multiple sclerosis: Secondary | ICD-10-CM

## 2016-01-04 LAB — CBC WITH DIFFERENTIAL/PLATELET
Basophils Absolute: 0.1 10*3/uL (ref 0–0.1)
Basophils Relative: 1 %
EOS ABS: 0.1 10*3/uL (ref 0–0.7)
EOS PCT: 1 %
HCT: 43 % (ref 35.0–47.0)
HEMOGLOBIN: 14.6 g/dL (ref 12.0–16.0)
LYMPHS ABS: 2.5 10*3/uL (ref 1.0–3.6)
LYMPHS PCT: 33 %
MCH: 30.3 pg (ref 26.0–34.0)
MCHC: 34.1 g/dL (ref 32.0–36.0)
MCV: 88.9 fL (ref 80.0–100.0)
MONOS PCT: 7 %
Monocytes Absolute: 0.5 10*3/uL (ref 0.2–0.9)
NEUTROS PCT: 58 %
Neutro Abs: 4.4 10*3/uL (ref 1.4–6.5)
Platelets: 228 10*3/uL (ref 150–440)
RBC: 4.84 MIL/uL (ref 3.80–5.20)
RDW: 12.3 % (ref 11.5–14.5)
WBC: 7.5 10*3/uL (ref 3.6–11.0)

## 2016-01-04 MED ORDER — MELOXICAM 15 MG PO TABS: 15.0000 mg | ORAL_TABLET | Freq: Every day | ORAL | Status: DC

## 2016-01-04 MED ORDER — MUPIROCIN 2 % EX OINT: 1.0000 "application " | TOPICAL_OINTMENT | Freq: Three times a day (TID) | CUTANEOUS | Status: DC

## 2016-01-04 MED ORDER — DOXYCYCLINE HYCLATE 100 MG PO CAPS: 100.0000 mg | ORAL_CAPSULE | Freq: Two times a day (BID) | ORAL | Status: DC

## 2016-01-04 MED ORDER — BUTALBITAL-APAP-CAFFEINE 50-325-40 MG PO TABS: 1.0000 | ORAL_TABLET | Freq: Four times a day (QID) | ORAL | Status: AC | PRN

## 2016-01-04 NOTE — Discharge Instructions (Signed)
Insect Bite Mosquitoes, flies, fleas, bedbugs, and other insects can bite. Insect bites are different from insect stings. The bite may be red, puffy (swollen), and itchy for 2 to 4 days. Most bites get better on their own. HOME CARE   Do not scratch the bite.  Keep the bite clean and dry. Wash the bite with soap and water every day, as told by your doctor.  If directed, apply ice to the bite area.  Put ice in a plastic bag.  Place a towel between your skin and the bag.  Leave the ice on for 20 minutes, 2-3 times per day.  Follow instructions from your doctor about using medicated lotions or creams. These can help with itching.  Apply or take over-the-counter and prescription medicines only as told by your doctor.  If you were given an antibiotic medicine, use it as told by your doctor. Do not stop using the medicine even if your condition improves.  Keep all follow-up visits as told by your doctor. This is important. GET HELP IF: 1. You have redness, swelling (inflammation), or pain near your bite that is getting worse. 2. You have a fever. GET HELP RIGHT AWAY IF:   You have joint pain.   You have fluid, blood, or pus coming from the bite area.   You have a headache.  You have neck pain.  You feel weaker than you normally do.   You have a rash.   You have chest pain.  You have shortness of breath.  You have stomach pain, feel sick to your stomach (nauseous), or throw up (vomit).  You feel more tired or sleepy than you normally do.   This information is not intended to replace advice given to you by your health care provider. Make sure you discuss any questions you have with your health care provider.   Document Released: 08/29/2000 Document Revised: 05/23/2015 Document Reviewed: 01/17/2015 Elsevier Interactive Patient Education 2016 [employer] Ticks are insects that attach themselves to the skin. There are many types of ticks.  Common types include wood ticks and deer ticks. Sometimes, ticks carry diseases that can make a person very ill. The most common places for ticks to attach themselves are the scalp, neck, armpits, waist, and groin.  HOW CAN YOU PREVENT TICK BITES? Take these steps to help prevent tick bites when you are outdoors:  Wear long sleeves and long pants.  Wear white clothes so you can see ticks more easily.  Tuck your pant legs into your socks.  If walking on a trail, stay in the middle of the trail to avoid brushing against bushes.  Avoid walking through areas with long grass.  Put bug spray on all skin that is showing and along boot tops, pant legs, and sleeve cuffs.  Check clothes, hair, and skin often and before going inside.  Brush off any ticks that are not attached.  Take a shower or bath as soon as possible after being outdoors. HOW SHOULD YOU REMOVE A TICK? Ticks should be removed as soon as possible to help prevent diseases. 3. If latex gloves are available, put them on before trying to remove a tick. 4. Use tweezers to grasp the tick as close to the skin as possible. You may also use curved forceps or a tick removal tool. Grasp the tick as close to its head as possible. Avoid grasping the tick on its body. 5. Pull gently upward until the tick lets go. Do  not twist the tick or jerk it suddenly. This may break off the tick's head or mouth parts. 6. Do not squeeze or crush the tick's body. This could force disease-carrying fluids from the tick into your body. 7. After the tick is removed, wash the bite area and your hands with soap and water or alcohol. 8. Apply a small amount of antiseptic cream or ointment to the bite site. 9. Wash any tools that were used. Do not try to remove a tick by applying a hot match, petroleum jelly, or fingernail polish to the tick. These methods do not work. They may also increase the chances of disease being spread from the tick bite. WHEN SHOULD YOU  SEEK HELP? Contact your health care provider if you are unable to remove a tick or if a part of the tick breaks off in the skin. After a tick bite, you need to watch for signs and symptoms of diseases that can be spread by ticks. Contact your health care provider if you develop any of the following:  Fever.  Rash.  Redness and puffiness (swelling) in the area of the tick bite.  Tender, puffy lymph glands.  Watery poop (diarrhea).  Weight loss.  Cough.  Feeling more tired than normal (fatigue).  Muscle, joint, or bone pain.  Belly (abdominal) pain.  Headache.  Change in your level of consciousness.  Trouble walking or moving your legs.  Loss of feeling (numbness) in the legs.  Loss of movement (paralysis).  Shortness of breath.  Confusion.  Throwing up (vomiting) many times.   This information is not intended to replace advice given to you by your health care provider. Make sure you discuss any questions you have with your health care provider.   Document Released: 11/26/2009 Document Revised: 05/04/2013 Document Reviewed: 02/09/2013 Elsevier Interactive Patient Education 2016 [employer] Occipital Neuralgia Occipital neuralgia is a type of headache that causes episodes of very bad pain in the back of your head. Pain from occipital neuralgia may spread (radiate) to other parts of your head. The pain is usually brief and often goes away after you rest and relax. These headaches may be caused by irritation of the nerves that leave your spinal cord high up in your neck, just below the base of your skull (occipital nerves). Your occipital nerves transmit sensations from the back of your head, the top of your head, and the areas behind your ears. CAUSES Occipital neuralgia can occur without any known cause (primary headache syndrome). In other cases, occipital neuralgia is caused by pressure on or irritation of one of the two occipital nerves. Causes of occipital nerve  compression or irritation include:  Wear and tear of the vertebrae in the neck (osteoarthritis).  Neck injury.  Disease of the disks that separate the vertebrae.  Tumors.  Gout.  Infections.  Diabetes.  Swollen blood vessels that put pressure on the occipital nerves.  Muscle spasm in the neck. SIGNS AND SYMPTOMS Pain is the main symptom of occipital neuralgia. It usually starts in the back of the head but may also be felt in other areas supplied by the occipital nerves. Pain is usually on one side but may be on both sides. You may have:  10. Brief episodes of very bad pain that is burning, stabbing, shocking, or shooting. 11. Pain behind the eye. 12. Pain triggered by neck movement or hair brushing. 13. Scalp tenderness. 14. Aching in the back of the head between episodes of very bad pain. DIAGNOSIS  Your health care provider may diagnose occipital neuralgia based on your symptoms and a physical exam. During the exam, the health care provider may push on areas supplied by the occipital nerves to see if they are painful. Some tests may also be done to help in making the diagnosis. These may include:  Imaging studies of the upper spinal cord, such as an MRI or CT scan. These may show compression or spinal cord abnormalities.  Nerve block. You will get an injection of numbing medicine (local anesthetic) near the occipital nerve to see if this relieves pain. TREATMENT  Treatment may begin with simple measures, such as:   Rest.  Massage.  Heat.  Over-the-counter pain relievers. If these measures do not work, you may need other treatments, including:  Medicines such as:  Prescription-strength anti-inflammatory medicines.  Muscle relaxants.  Antiseizure medicines.  Antidepressants.  Steroid injection. This involves injections of local anesthetic and strong anti-inflammatory drugs (steroids).  Pulsed radiofrequency. Wires are implanted to deliver electrical impulses  that block pain signals from the occipital nerve.  Physical therapy.  Surgery to relieve nerve pressure. HOME CARE INSTRUCTIONS  Take all medicines as directed by your health care provider.  Avoid activities that cause pain.  Rest when you have an attack of pain.  Try gentle massage or a heating pad to relieve pain.  Work with a physical therapist to learn stretching exercises you can do at home.  Try a different pillow or sleeping position.  Practice good posture.  Try to stay active. Get regular exercise that does not cause pain. Ask your health care provider to suggest safe exercises for you.  Keep all follow-up visits as directed by your health care provider. This is important. SEEK MEDICAL CARE IF:  Your medicine is not working.  You have new or worsening symptoms. SEEK IMMEDIATE MEDICAL CARE IF:  You have very bad head pain that is not going away.  You have a sudden change in vision, balance, or speech. MAKE SURE YOU:  Understand these instructions.  Will watch your condition.  Will get help right away if you are not doing well or get worse.   This information is not intended to replace advice given to you by your health care provider. Make sure you discuss any questions you have with your health care provider.   Document Released: 08/26/2001 Document Revised: 09/22/2014 Document Reviewed: 08/24/2013 Elsevier Interactive Patient Education [employer].

## 2016-01-04 NOTE — ED Notes (Signed)
Patient states that she pulled off a tick from her back last night.  Patient reports HAs since Saturday.

## 2016-01-04 NOTE — ED Provider Notes (Signed)
CSN: [id number]     Arrival date & time 01/04/16  1502 History   First MD Initiated Contact with Patient 01/04/16 1516    Nurses notes were reviewed. Chief Complaint  Patient presents with  . Insect Bite   Patient is a 30 year old white female who brings in her 4 sons one of which is at to be seen and evaluated. 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 She is since this had a headache since then some day and sometimes more intense than others. She states that she has a history of MS and a benign brain tumor and she was on go to [facility] ED tonight if she wasn't getting better until which was taking a shower today she felt a bump on her right lower back and found a tick present. She was able to remove the tick in with peers BS entirety but now she is concerned the tick bite is related to her headaches and that the headache is coming from [facility] spotted fever or Lyme's disease.  Has a history of MS, brain tumor at this point present for about 2 years she says not getting any bigger, history of epilepsy HPV and GERD so she does not smoke and her father has elevated cholesterol.  (Consider location/radiation/quality/duration/timing/severity/associated sxs/prior Treatment) Patient is a 30 y.o. female presenting with headaches and animal bite. The history is provided by the patient. No language interpreter was used.  Headache Pain location:  Occipital Quality:  Stabbing Radiates to:  R neck Severity currently:  7/10 Duration:  7 days Progression:  Waxing and waning Chronicity:  New Context: straining   Context comment:  Removing the head or straining the neck seems to make the headache worse Relieved by:  Nothing Ineffective treatments:  Acetaminophen Associated symptoms: myalgias   Animal Bite Contact animal:  Insect Location:  Torso Torso injury location:  Back Time since incident:  2  hours Pain details:    Severity:  Moderate Incident location:  Home (In the shower on the right lower back)   Past Medical History  Diagnosis Date  . Breastfeeding (infant)   . GERD (gastroesophageal reflux disease)   . Brain tumor ([place])   . Epilepsy ([place])   . Multiple sclerosis ([place])   . HPV in female    Past Surgical History  Procedure Laterality Date  . Wisdom tooth extraction  2003  . Leep  2005  . Wada test     Family History  Problem Relation Age of Onset  . High Cholesterol Father   . Ovarian cancer Neg Hx   . Diabetes Neg Hx   . Heart disease Neg Hx   . Colon cancer Neg Hx   . Cancer Neg Hx   . Breast cancer Other    Social History  Substance Use Topics  . Smoking status: Never Smoker   . Smokeless tobacco: None  . Alcohol Use: No   OB History    No data available     Review of Systems  Musculoskeletal: Positive for myalgias.  Neurological: Positive for headaches.  Psychiatric/Behavioral: The patient is nervous/anxious.   All other systems reviewed and are negative.   Allergies  Promethazine hcl and Clindamycin  Home Medications   Prior to Admission medications   Medication Sig Start Date End Date Taking? Authorizing Provider  butalbital-acetaminophen-caffeine (FIORICET) 50-325-40 MG tablet Take 1 tablet by mouth  every 6 (six) hours as needed for headache. 01/04/16 01/03/17  Frederich Cha, MD  doxycycline (VIBRAMYCIN) 100 MG capsule Take 1 capsule (100 mg total) by mouth 2 (two) times daily. 01/04/16   Frederich Cha, MD  lamoTRIgine (LAMICTAL) 200 MG tablet Take by mouth.    Historical Provider, MD  levETIRAcetam (KEPPRA) 500 MG tablet Take by mouth. 06/08/12   Historical Provider, MD  meloxicam (MOBIC) 15 MG tablet Take 1 tablet (15 mg total) by mouth daily. 01/04/16   Frederich Cha, MD  mupirocin ointment (BACTROBAN) 2 % Apply 1 application topically 3 (three) times daily. Place on tick site 01/04/16   Frederich Cha, MD  NEXIUM 40 MG capsule TAKE 1 CAPSULE  DAILY 11/26/15   Brayton Mars, MD  norethindrone (MICRONOR,CAMILA,ERRIN) 0.35 MG tablet Take 1 tablet (0.35 mg total) by mouth daily. 09/06/15   Alanda Slim Defrancesco, MD  Omega-3 1000 MG CAPS Take by mouth.    Historical Provider, MD  Prenat-Fe Bisgly-FA-w/o Vit A (COMPLETE PRENATAL) 30-0.975 MG TABS Take by mouth. 06/03/11   Historical Provider, MD   Meds Ordered and Administered this Visit  Medications - No data to display  BP 111/76 mmHg  Pulse 71  Temp(Src) 97.4 F (36.3 C) (Tympanic)  Resp 16  Ht 5\' 3"  (1.6 m)  Wt 180 lb (81.647 kg)  BMI 31.89 kg/m2  SpO2 100% No data found.   Physical Exam  Constitutional: She is oriented to person, place, and time. She appears well-developed and well-nourished.  HENT:  Head: Normocephalic and atraumatic.    She is tenderness over the of the right occipital prominence. When this area is palpated she states is very painful and reproduces the pain and since and discomfort she feels it goes up her head and in her neck.  Eyes: Conjunctivae are normal. Pupils are equal, round, and reactive to light.  Neck: Normal range of motion. Neck supple.  Musculoskeletal: She exhibits no tenderness.  Neurological: She is alert and oriented to person, place, and time. No cranial nerve deficit.  Skin: Rash noted. There is erythema.     Dizziness light red rash where the tick apparently was attached to the back. It appears she has gotten to [place]irety. There is some redness at the site.  Psychiatric: She has a normal mood and affect.  Vitals reviewed.   ED Course  Procedures (including critical care time)  Labs Review Labs Reviewed  ROCKY MTN SPOTTED FVR ABS PNL(IGG+IGM)  B. BURGDORFI ANTIBODIES  CBC WITH DIFFERENTIAL/PLATELET    Imaging Review No results found.   Visual Acuity Review  Right Eye Distance:   Left Eye Distance:   Bilateral Distance:    Right Eye Near:   Left Eye Near:    Bilateral Near:         MDM   1. Tick  bite of back, initial encounter   2. Occipital headache   3. Occipital neuralgia of right side   4. Benign neoplasm of brain, unspecified brain region ([place])   5. MS (multiple sclerosis) ([place])    I tried to explain to her that the occipital prominence and irritation is different from the tick bite. While willing to check RMSF levels Lyme's levels and even place her on 3 days of doxycycline until we get the results back of the test and placed her on Bactroban ointment to try to prevent a staph aureus cellulitis infection from occurring at the tick bite this tick bite this on her right lower back is  not chronic cause occipital swelling or prominence to occur. We'll also try to reassure her that the occipital prominence is not coming from the brain tumor that she has has benign nor is it coming from her MS since this is over the skull. 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 Which is great.  However as I tried to explain things to her she constantly interrupts and asked questions like does every one have an occipital prominence or is this only found on special people. She also when I showed her that she has a bilateral occipital prominence and that the one on the left side is not tender to palpation why is one on the right side tender which of course I have no answer and unfortunately also while she is continue to interrupt my discussion I watch her infant toddler child pull repeatedly at the emergency cord though her phone off the bed jump off the bed and try to put his finger into the electrical socket this is after removing 3 older children and putting them in a separate room for the nurse watch.   At this time I will print out information for her to read when she gets home I will get blood levels as mentioned above will place on up prescription for  doxycycline for 3 days Bactroban ointment and 12 Furacin tablets for the occipital headache and Mobic for inflammation for Lexapro prominence I did explain to her that her neurologist may need to inject the obstacle prominence to help relieve the pain but at this point time I don't feel comfortable doing any injections on her.  Please follow-up with PCP when able  Note: This dictation was prepared with Dragon dictation along with smaller phrase technology. Any transcriptional errors that result from this process are unintentional.   Frederich Cha, MD 01/04/16 [phone]

## 2016-01-07 LAB — ROCKY MTN SPOTTED FVR ABS PNL(IGG+IGM)
RMSF IGM: 0.49 {index} (ref 0.00–0.89)
RMSF IgG: NEGATIVE

## 2016-01-07 LAB — B. BURGDORFI ANTIBODIES

## 2016-05-06 ENCOUNTER — Telehealth: Payer: Self-pay | Admitting: Obstetrics and Gynecology

## 2016-05-06 MED ORDER — FLUCONAZOLE 150 MG PO TABS: 150.0000 mg | ORAL_TABLET | Freq: Every day | ORAL | 0 refills | Status: DC

## 2016-05-06 NOTE — Telephone Encounter (Signed)
Y/i sx 2 days. Itchy and burning. D/c white and thick. Diflucan erx. Pt aware if sx after 7 days she will need to be seen.

## 2016-05-06 NOTE — Addendum Note (Signed)
Addended by: Elouise Munroe on: 05/06/2016 04:54 PM   Modules accepted: Orders

## 2016-05-06 NOTE — Telephone Encounter (Signed)
Pt has a yeast inf, burning itching white discharge. ([facility])
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
## 2016-05-27 ENCOUNTER — Encounter: Payer: Self-pay | Admitting: Obstetrics and Gynecology

## 2016-05-27 ENCOUNTER — Ambulatory Visit (INDEPENDENT_AMBULATORY_CARE_PROVIDER_SITE_OTHER): Admitting: Obstetrics and Gynecology

## 2016-05-27 VITALS — BP 111/73 | HR 71 | Ht 63.0 in | Wt 191.9 lb

## 2016-05-27 DIAGNOSIS — E669 Obesity, unspecified: Secondary | ICD-10-CM | POA: Diagnosis not present

## 2016-05-27 DIAGNOSIS — F419 Anxiety disorder, unspecified: Secondary | ICD-10-CM

## 2016-05-27 DIAGNOSIS — K219 Gastro-esophageal reflux disease without esophagitis: Secondary | ICD-10-CM | POA: Diagnosis not present

## 2016-05-27 DIAGNOSIS — Z01419 Encounter for gynecological examination (general) (routine) without abnormal findings: Secondary | ICD-10-CM

## 2016-05-27 DIAGNOSIS — Z9149 Other personal history of psychological trauma, not elsewhere classified: Secondary | ICD-10-CM

## 2016-05-27 DIAGNOSIS — IMO0002 Reserved for concepts with insufficient information to code with codable children: Secondary | ICD-10-CM

## 2016-05-27 NOTE — Patient Instructions (Signed)
1. Pap smear is done 2. Self breast is encouraged 3. Continue with progestin only contraception 4. Recommend counseling for marital stressors 5. Healthy eating and exercise 6. Return in 1 year

## 2016-05-30 LAB — PAP IG W/ RFLX HPV ASCU: PAP Smear Comment: 0

## 2016-10-13 ENCOUNTER — Other Ambulatory Visit: Payer: Self-pay | Admitting: Obstetrics and Gynecology

## 2016-12-24 ENCOUNTER — Telehealth: Payer: Self-pay | Admitting: Obstetrics and Gynecology

## 2016-12-24 NOTE — Telephone Encounter (Signed)
Patient is taking BC pills but is having a period every month - she doesn't want to have a period but wants to stay with BC pills can she try a different kind?? Pharm: [facility] Please call

## 2016-12-24 NOTE — Telephone Encounter (Signed)
Pt would like to change her b/c. Is currently taking progesterone only. Has not BF in 9 months. She prefers to not have a cycle. Pt aware will send message to mad. Pls advise. ty

## 2016-12-25 MED ORDER — LEVONORGEST-ETH ESTRAD 91-DAY 0.15-0.03 MG PO TABS: 1.0000 | ORAL_TABLET | Freq: Every day | ORAL | 4 refills | Status: DC

## 2016-12-25 NOTE — Telephone Encounter (Signed)
Pt aware per mad ok to start seasonale. Med erx.

## 2017-02-04 ENCOUNTER — Other Ambulatory Visit: Payer: Self-pay

## 2017-02-04 MED ORDER — LEVONORGEST-ETH ESTRAD 91-DAY 0.15-0.03 MG PO TABS: 1.0000 | ORAL_TABLET | Freq: Every day | ORAL | 1 refills | Status: AC

## 2017-05-29 NOTE — Progress Notes (Deleted)
ANNUAL PREVENTATIVE CARE GYN  ENCOUNTER NOTE  Subjective:       Tiffany Tucker is a 31 y.o. para G5 P25 female here for a routine annual gynecologic exam.  Current complaints: 1.     Gynecologic History No LMP recorded. Patient is not currently having periods (Reason: Oral contraceptives). Contraception: oral progesterone-only contraceptive Last Pap: 03/2015 neg/neg. Results were: normal Last mammogram: due age 44. Results were: Marland Kitchen  Obstetric History OB History  Gravida Para Term Preterm AB Living  5 4 4   1 4   SAB TAB Ectopic Multiple Live Births  1       4    # Outcome Date GA Lbr Len/2nd Weight Sex Delivery Anes PTL Lv  5 Term 2015   8 lb 9.6 oz (3.901 kg) M Vag-Spont   LIV  4 Term 2012   7 lb 2.4 oz (3.243 kg) M Vag-Spont   LIV  3 Term 2010   6 lb 2.4 oz (2.79 kg) M Vag-Spont   LIV  2 Term 2009   7 lb 2.2 oz (3.239 kg) M Vag-Spont   LIV  1 SAB 2008              Past Medical History:  Diagnosis Date  . Brain tumor ([place])   . Breastfeeding (infant)   . Epilepsy ([place])   . GERD (gastroesophageal reflux disease)   . HPV in female   . Multiple sclerosis ([place])     Past Surgical History:  Procedure Laterality Date  . LEEP  2005  . WADA test    . WISDOM TOOTH EXTRACTION  2003    Current Outpatient Prescriptions on File Prior to Visit  Medication Sig Dispense Refill  . lamoTRIgine (LAMICTAL) 200 MG tablet Take by mouth.    . levETIRAcetam (KEPPRA) 500 MG tablet Take by mouth.    . levonorgestrel-ethinyl estradiol (SEASONALE,INTROVALE,JOLESSA) 0.15-0.03 MG tablet Take 1 tablet by mouth daily. 3 Package 1  . NORA-BE 0.35 MG tablet TAKE 1 TABLET DAILY 84 tablet 4  . Omega-3 1000 MG CAPS Take by mouth.    . Prenat-Fe Bisgly-FA-w/o Vit A (COMPLETE PRENATAL) 30-0.975 MG TABS Take by mouth.     No current facility-administered medications on file prior to visit.     Allergies  Allergen Reactions  . Promethazine Hcl Other (See Comments)    RLS  .  Clindamycin Diarrhea    Had c.diff colitis    Social History   Social History  . Marital status: Married    Spouse name: N/A  . Number of children: N/A  . Years of education: N/A   Occupational History  . Not on file.   Social History Main Topics  . Smoking status: Never Smoker  . Smokeless tobacco: Not on file  . Alcohol use No  . Drug use: No  . Sexual activity: Yes    Birth control/ protection: Pill   Other Topics Concern  . Not on file   Social History Narrative  . No narrative on file    Family History  Problem Relation Age of Onset  . Breast cancer Other   . High Cholesterol Father   . Ovarian cancer Neg Hx   . Diabetes Neg Hx   . Heart disease Neg Hx   . Colon cancer Neg Hx   . Cancer Neg Hx     The following portions of the patient's history were reviewed and updated as appropriate: allergies, current medications, past family history, past medical  history, past social history, past surgical history and problem list.  Review of Systems ROS es Dermatological ROS: negative for rash and skin lesion changes   Objective:   There were no vitals taken for this visit. CONSTITUTIONAL: Well-developed, well-nourished female in no acute distress.  PSYCHIATRIC: Normal mood and affect. Normal behavior. Normal judgment and thought content. [place]: Alert and oriented to person, place, and time. Normal muscle tone coordination. No cranial nerve deficit noted. HENT:  Normocephalic, atraumatic, External right and left ear normal. Oropharynx is clear and moist EYES: Conjunctivae and EOM are normal. Pupils are equal, round, and reactive to light. No scleral icterus.  NECK: Normal range of motion, supple, no masses.  Normal thyroid.  SKIN: Skin is warm and dry. No rash noted. Not diaphoretic. No erythema. No pallor. CARDIOVASCULAR: Normal heart rate noted, regular rhythm, no murmur. RESPIRATORY: Clear to auscultation bilaterally. Effort and breath sounds normal, no  problems with respiration noted. BREASTS: Symmetric in size. No masses, skin changes, nipple drainage, or lymphadenopathy. ABDOMEN: Soft, normal bowel sounds, no distention noted.  No tenderness, rebound or guarding.  BLADDER: Normal PELVIC:  External Genitalia: Normal  BUS: Normal  Vagina: Normal  Cervix: Normal; No lesions; no cervical motion tenderness; no discharge  Uterus: Normal; midplane, normal size and shape, mobile, nontender  Adnexa: Normal  RV: External Exam NormaI  MUSCULOSKELETAL: Normal range of motion. No tenderness.  No cyanosis, clubbing, or edema.  2+ distal pulses. LYMPHATIC: No Axillary, Supraclavicular, or Inguinal Adenopathy. Suboccipital lymph node present    Assessment:   Annual gynecologic examination 31 y.o. Contraception: oral progesterone-only contraceptive Obesity 1 Problem List Items Addressed This Visit    None    Visit Diagnoses    Well woman exam with routine gynecological exam    -  Primary   Class 1 obesity       Anxiety        Lactational amenorrhea Marital stressors Anxiety Plan:  Pap: Due 2019 Mammogram: Not Indicated Stool Guaiac Testing:  Not Indicated Labs: Lipid 1, FBS, TSH, Hemoglobin A1C and Vit D Level"". Routine preventative health maintenance measures emphasized: Exercise/Diet/Weight control, Tobacco Warnings, Alcohol/Substance use risks, Stress Management and Safe Sex Counseling recommended Return to [place], [place]   Note: This dictation was prepared with Dragon dictation along with smaller phrase technology. Any transcriptional errors that result from this process are unintentional.

## 2017-06-02 ENCOUNTER — Encounter: Admitting: Obstetrics and Gynecology

## 2018-04-22 ENCOUNTER — Other Ambulatory Visit: Payer: Self-pay | Admitting: Obstetrics and Gynecology

## 2021-09-12 ENCOUNTER — Ambulatory Visit: Admit: 2021-09-12 | Discharge status: Absent without leave

## 2021-09-12 ENCOUNTER — Ambulatory Visit: Admission: EM | Admit: 2021-09-12 | Discharge: 2021-09-12 | Disposition: A | Discharge status: Home / Self Care

## 2021-09-12 ENCOUNTER — Other Ambulatory Visit: Payer: Self-pay

## 2021-09-12 DIAGNOSIS — J209 Acute bronchitis, unspecified: Secondary | ICD-10-CM

## 2021-09-12 DIAGNOSIS — R062 Wheezing: Secondary | ICD-10-CM | POA: Diagnosis not present

## 2021-09-12 DIAGNOSIS — R051 Acute cough: Secondary | ICD-10-CM

## 2021-09-12 MED ORDER — PREDNISONE 20 MG PO TABS: 40.0000 mg | ORAL_TABLET | Freq: Every day | ORAL | 0 refills | Status: AC

## 2021-09-12 MED ORDER — ALBUTEROL SULFATE HFA 108 (90 BASE) MCG/ACT IN AERS
1.0000 | INHALATION_SPRAY | Freq: Four times a day (QID) | RESPIRATORY_TRACT | 0 refills | Status: AC | PRN
Start: 2021-09-12 — End: ?
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
## 2021-09-12 NOTE — Discharge Instructions (Addendum)
-  Symptoms and exam consistent with acute bronchitis which as we discussed it is greater than 90 to 95% of the time due to a viral cause.  Can take 2 to 4 weeks to run its course.  I have sent corticosteroids to the pharmacy to help decrease inflammation and open up your airway.  Also sent inhaler as needed for breathing difficulty or wheezing. -Take Mucinex during the day and drink plenty of water. - Take the NyQuil at night to help you sleep. - You should be seen again if you develop a fever, worsening cough or increased breathing difficulty or if you are not better after couple of weeks.

## 2021-09-12 NOTE — ED Triage Notes (Signed)
Pt c/o cough x5-6days. Pt states that she feels "pop rocks" in her upper chest.

## 2021-11-29 ENCOUNTER — Ambulatory Visit (INDEPENDENT_AMBULATORY_CARE_PROVIDER_SITE_OTHER)

## 2021-11-29 ENCOUNTER — Ambulatory Visit

## 2021-11-29 ENCOUNTER — Ambulatory Visit: Admission: EM | Admit: 2021-11-29 | Discharge: 2021-11-29 | Disposition: A | Discharge status: Home / Self Care

## 2021-11-29 ENCOUNTER — Encounter: Payer: Self-pay | Admitting: Emergency Medicine

## 2021-11-29 ENCOUNTER — Other Ambulatory Visit: Payer: Self-pay

## 2021-11-29 DIAGNOSIS — M25512 Pain in left shoulder: Secondary | ICD-10-CM

## 2021-11-29 DIAGNOSIS — M549 Dorsalgia, unspecified: Secondary | ICD-10-CM

## 2021-11-29 DIAGNOSIS — M542 Cervicalgia: Secondary | ICD-10-CM

## 2021-11-29 NOTE — Discharge Instructions (Signed)
NECK PAIN: Stressed avoiding painful activities. This can exacerbate your symptoms and make them worse.  May apply heat to the areas of pain for some relief. Use medications as directed. Be aware of which medications make you drowsy and do not drive or operate any kind of heavy machinery while using the medication (ie pain medications or muscle relaxers). F/U with PCP for reexamination or return sooner if condition worsens or does not begin to improve over the next few days.  ? ?NECK PAIN RED FLAGS: If symptoms get worse than they are right now, you should come back sooner for re-evaluation. If you have increased numbness/ tingling or notice that the numbness/tingling is affecting the legs or saddle region, go to ER. If you ever lose continence go to ER.     ? ?BACK PAIN: Stressed avoiding painful activities . RICE (REST, ICE, COMPRESSION, ELEVATION) guidelines reviewed. May alternate ice and heat. Consider use of muscle rubs, Salonpas patches, etc. Use medications as directed including muscle relaxers if prescribed. Take anti-inflammatory medications as prescribed or OTC NSAIDs/Tylenol.  F/u with PCP in 7-10 days for reexamination, and please feel free to call or return to the urgent care at any time for any questions or concerns you may have and we will be happy to help you!  ? ?BACK PAIN RED FLAGS: If the back pain acutely worsens or there are any red flag symptoms such as numbness/tingling, leg weakness, saddle anesthesia, or loss of bowel/bladder control, go immediately to the ER. Follow up with [place] as scheduled or sooner if the pain does not begin to resolve or if it worsens before the follow up   ?
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
## 2021-12-03 ENCOUNTER — Other Ambulatory Visit: Payer: Self-pay

## 2021-12-03 ENCOUNTER — Ambulatory Visit
Admission: EM | Admit: 2021-12-03 | Discharge: 2021-12-03 | Disposition: A | Discharge status: Home / Self Care | Attending: Internal Medicine | Admitting: Internal Medicine

## 2021-12-03 ENCOUNTER — Encounter: Payer: Self-pay | Admitting: Emergency Medicine

## 2021-12-03 DIAGNOSIS — S069X0D Unspecified intracranial injury without loss of consciousness, subsequent encounter: Secondary | ICD-10-CM

## 2021-12-03 DIAGNOSIS — R519 Headache, unspecified: Secondary | ICD-10-CM

## 2021-12-03 MED ORDER — KETOROLAC TROMETHAMINE 60 MG/2ML IM SOLN
60.0000 mg | Freq: Once | INTRAMUSCULAR | Status: AC
  Administered 2021-12-03: 60 mg via INTRAMUSCULAR

## 2021-12-03 NOTE — ED Triage Notes (Signed)
Also reports intermittent nausea.  ?
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
## 2021-12-03 NOTE — Discharge Instructions (Addendum)
Symptoms today are consistent with those seen after a 'mild' traumatic brain injury.  These typically resolve on their own within a week or two.  Injection of ketorolac (anti inflammatory/pain reliever) given at urgent care to try to help with headache.  Ok to take aleve or tylenol otc.  Push fluids--keeping your tank full can help with headache.  Try ice for 5-10 minutes several times daily to headache location.  Limit screen time, and activities or environments that make the headache worse.  Try to get some help with the kids.  Eat regularly and get enough rest and sleep.  Check in with your neurologists, they may have additional suggestions for managing headache.  Keep followup appointment with your primary care provider on Monday 3/27.   ?
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
